# Patient Record
Sex: Male | Born: 1995 | Race: Black or African American | Hispanic: No | Marital: Single | Smoking: Former smoker
Health system: Southern US, Community
[De-identification: ages and names within clinical notes are randomized; demographics above are authoritative.]

## PROBLEM LIST (undated history)

## (undated) DIAGNOSIS — R569 Unspecified convulsions: Secondary | ICD-10-CM

## (undated) HISTORY — PX: OTHER SURGICAL HISTORY: SHX169

---

## 2016-06-17 ENCOUNTER — Encounter (HOSPITAL_COMMUNITY): Payer: Self-pay | Admitting: Emergency Medicine

## 2016-06-17 ENCOUNTER — Emergency Department (HOSPITAL_COMMUNITY)
Admission: EM | Admit: 2016-06-17 | Discharge: 2016-06-17 | Discharge status: Against Medical Advice | Payer: Medicaid Other | Attending: Emergency Medicine | Admitting: Emergency Medicine

## 2016-06-17 DIAGNOSIS — G40909 Epilepsy, unspecified, not intractable, without status epilepticus: Secondary | ICD-10-CM | POA: Diagnosis present

## 2016-06-17 DIAGNOSIS — R569 Unspecified convulsions: Secondary | ICD-10-CM

## 2016-06-17 DIAGNOSIS — F17290 Nicotine dependence, other tobacco product, uncomplicated: Secondary | ICD-10-CM | POA: Insufficient documentation

## 2016-06-17 HISTORY — DX: Unspecified convulsions: R56.9

## 2016-06-17 MED ORDER — LEVETIRACETAM IN NACL 1000 MG/100ML IV SOLN: 1000.0000 mg | Freq: Once | INTRAVENOUS | Status: DC

## 2016-06-17 NOTE — ED Notes (Signed)
Pt wants to leave AMA. Dr Aileen PilotZammitt at bedside and aware of pt's decision.

## 2016-06-17 NOTE — ED Provider Notes (Signed)
[facility] Provider Note   CSN: [id number] Arrival date & time: 06/17/16  1901     History   Chief Complaint Chief Complaint  Patient presents with  . Seizures    HPI Larry Wheeler is a 21 y.o. male.  Patient had a seizure today. He did not take any of his medicines today. Although his family made him take a double dose of Keppra after he had a seizure    Seizures   This is a recurrent problem. The current episode started 1 to 2 hours ago. The problem has been resolved. There was 1 seizure. Pertinent negatives include no sleepiness, no headaches, no chest pain, no cough and no diarrhea. Characteristics do not include eye blinking. The episode was witnessed. There was no sensation of an aura present. The seizures did not continue in the ED. The seizure(s) had no focality. There has been no fever.    Past Medical History:  Diagnosis Date  . Seizures (HCC)     There are no active problems to display for this patient.   History reviewed. No pertinent surgical history.     Home Medications    Prior to Admission medications   Not on File    Family History No family history on file.  Social History Social History  Substance Use Topics  . Smoking status: Current Some Day Smoker    Types: Cigars  . Smokeless tobacco: Never Used  . Alcohol use No     Allergies   Patient has no known allergies.   Review of Systems Review of Systems  Constitutional: Negative for appetite change and fatigue.  HENT: Negative for congestion, ear discharge and sinus pressure.   Eyes: Negative for discharge.  Respiratory: Negative for cough.   Cardiovascular: Negative for chest pain.  Gastrointestinal: Negative for abdominal pain and diarrhea.  Genitourinary: Negative for frequency and hematuria.  Musculoskeletal: Negative for back pain.  Skin: Negative for rash.  Neurological: Positive for seizures. Negative for headaches.  Psychiatric/Behavioral: Negative for  hallucinations.     Physical Exam Updated Vital Signs BP 141/80 (BP Location: Right Arm)   Pulse 76   Temp 98.5 F (36.9 C) (Oral)   Resp 18   Ht 6' (1.829 m)   Wt 185 lb (83.9 kg)   SpO2 100%   BMI 25.09 kg/m   Physical Exam  Constitutional: He is oriented to person, place, and time. He appears well-developed.  HENT:  Head: Normocephalic.  Eyes: Conjunctivae and EOM are normal. No scleral icterus.  Neck: Neck supple. No thyromegaly present.  Cardiovascular: Normal rate and regular rhythm.  Exam reveals no gallop and no friction rub.   No murmur heard. Pulmonary/Chest: No stridor. He has no wheezes. He has no rales. He exhibits no tenderness.  Abdominal: He exhibits no distension. There is no tenderness. There is no rebound.  Musculoskeletal: Normal range of motion. He exhibits no edema.  Lymphadenopathy:    He has no cervical adenopathy.  Neurological: He is oriented to person, place, and time. He exhibits normal muscle tone. Coordination normal.  Skin: No rash noted. No erythema.  Psychiatric: He has a normal mood and affect. His behavior is normal.     ED Treatments / Results  Labs (all labs ordered are listed, but only abnormal results are displayed) Labs Reviewed  I-STAT CHEM 8, ED    EKG  EKG Interpretation None       Radiology No results found.  Procedures Procedures (including critical care time)  Medications Ordered in ED Medications - No data to display   Initial Impression / Assessment and Plan / ED Course  I have reviewed the triage vital signs and the nursing notes.  Pertinent labs & imaging results that were available during my care of the patient were reviewed by me and considered in my medical decision making (see chart for details).     Patient was alert and oriented 4. Patient stated he did not want to get medical care now. That he just missed his medicine. He wants to be discharged home. Patient left AMA  Final Clinical  Impressions(s) / ED Diagnoses   Final diagnoses:  None    New Prescriptions New Prescriptions   No medications on file     Bethann Berkshire, MD 06/17/16 [phone]

## 2016-06-17 NOTE — ED Notes (Signed)
Per Dr. Aileen PilotZammitt, pt to take 2 tablets of his own Trileptal and one tablet of his own Lexapro.

## 2016-06-17 NOTE — ED Triage Notes (Signed)
Pt brought in by [facility] for seizure lasting approximately 3 minutes. Pt stated he didn't take his seizure medication today because he "got too busy".

## 2020-09-16 ENCOUNTER — Emergency Department (HOSPITAL_COMMUNITY)
Admission: EM | Admit: 2020-09-16 | Discharge: 2020-09-17 | Disposition: A | Discharge status: Home / Self Care | Payer: Self-pay | Attending: Emergency Medicine | Admitting: Emergency Medicine

## 2020-09-16 ENCOUNTER — Emergency Department (HOSPITAL_COMMUNITY): Payer: Self-pay

## 2020-09-16 ENCOUNTER — Other Ambulatory Visit: Payer: Self-pay

## 2020-09-16 ENCOUNTER — Encounter (HOSPITAL_COMMUNITY): Payer: Self-pay | Admitting: Emergency Medicine

## 2020-09-16 DIAGNOSIS — T148XXA Other injury of unspecified body region, initial encounter: Secondary | ICD-10-CM

## 2020-09-16 DIAGNOSIS — W500XXA Accidental hit or strike by another person, initial encounter: Secondary | ICD-10-CM | POA: Insufficient documentation

## 2020-09-16 DIAGNOSIS — Y92009 Unspecified place in unspecified non-institutional (private) residence as the place of occurrence of the external cause: Secondary | ICD-10-CM | POA: Insufficient documentation

## 2020-09-16 DIAGNOSIS — Z23 Encounter for immunization: Secondary | ICD-10-CM | POA: Insufficient documentation

## 2020-09-16 DIAGNOSIS — S21112A Laceration without foreign body of left front wall of thorax without penetration into thoracic cavity, initial encounter: Secondary | ICD-10-CM | POA: Insufficient documentation

## 2020-09-16 HISTORY — DX: Unspecified convulsions: R56.9

## 2020-09-16 LAB — PROTIME-INR
INR: 1 (ref 0.8–1.2)
Prothrombin Time: 13.4 seconds (ref 11.4–15.2)

## 2020-09-16 LAB — COMPREHENSIVE METABOLIC PANEL
ALT: 15 U/L (ref 0–44)
AST: 24 U/L (ref 15–41)
Albumin: 3.9 g/dL (ref 3.5–5.0)
Alkaline Phosphatase: 49 U/L (ref 38–126)
Anion gap: 8 (ref 5–15)
BUN: 14 mg/dL (ref 6–20)
CO2: 25 mmol/L (ref 22–32)
Calcium: 9.1 mg/dL (ref 8.9–10.3)
Chloride: 109 mmol/L (ref 98–111)
Creatinine, Ser: 1.07 mg/dL (ref 0.61–1.24)
GFR, Estimated: >60 mL/min (ref >60)
Glucose, Bld: 94 mg/dL (ref 70–99)
Potassium: 3.9 mmol/L (ref 3.5–5.1)
Sodium: 142 mmol/L (ref 135–145)
Total Bilirubin: 0.6 mg/dL (ref 0.3–1.2)
Total Protein: 6.4 g/dL — ABNORMAL LOW (ref 6.5–8.1)

## 2020-09-16 LAB — CBC
HCT: 37.8 % — ABNORMAL LOW (ref 39.0–52.0)
Hemoglobin: 13.1 g/dL (ref 13.0–17.0)
MCH: 28.5 pg (ref 26.0–34.0)
MCHC: 34.7 g/dL (ref 30.0–36.0)
MCV: 82.2 fL (ref 80.0–100.0)
Platelets: 202 10*3/uL (ref 150–400)
RBC: 4.6 MIL/uL (ref 4.22–5.81)
RDW: 12.8 % (ref 11.5–15.5)
WBC: 7 10*3/uL (ref 4.0–10.5)
nRBC: 0 % (ref 0.0–0.2)

## 2020-09-16 LAB — I-STAT CHEM 8, ED
BUN: 16 mg/dL (ref 6–20)
Calcium, Ion: 1.16 mmol/L (ref 1.15–1.40)
Chloride: 107 mmol/L (ref 98–111)
Creatinine, Ser: 0.9 mg/dL (ref 0.61–1.24)
Glucose, Bld: 95 mg/dL (ref 70–99)
HCT: 37 % — ABNORMAL LOW (ref 39.0–52.0)
Hemoglobin: 12.6 g/dL — ABNORMAL LOW (ref 13.0–17.0)
Potassium: 3.7 mmol/L (ref 3.5–5.1)
Sodium: 143 mmol/L (ref 135–145)
TCO2: 25 mmol/L (ref 22–32)

## 2020-09-16 LAB — ETHANOL: Alcohol, Ethyl (B): <10 mg/dL (ref <10)

## 2020-09-16 LAB — SAMPLE TO BLOOD BANK

## 2020-09-16 LAB — LACTIC ACID, PLASMA: Lactic Acid, Venous: 1.3 mmol/L (ref 0.5–1.9)

## 2020-09-16 MED ORDER — IOHEXOL 300 MG/ML  SOLN
75.0000 mL | Freq: Once | INTRAMUSCULAR | Status: AC | PRN
  Administered 2020-09-16: 75 mL via INTRAVENOUS

## 2020-09-16 MED ORDER — TETANUS-DIPHTH-ACELL PERTUSSIS 5-2.5-18.5 LF-MCG/0.5 IM SUSY
0.5000 mL | PREFILLED_SYRINGE | Freq: Once | INTRAMUSCULAR | Status: AC
  Administered 2020-09-16: 0.5 mL via INTRAMUSCULAR

## 2020-09-16 MED ORDER — CEFAZOLIN SODIUM 1 G IJ SOLR
INTRAMUSCULAR | Status: AC | PRN
  Administered 2020-09-16: 2 g via INTRAVENOUS

## 2020-09-16 MED ORDER — LIDOCAINE HCL (PF) 1 % IJ SOLN
30.0000 mL | Freq: Once | INTRAMUSCULAR | Status: AC
  Administered 2020-09-16: 30 mL via INTRADERMAL
  Filled 2020-09-16: qty 30
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
## 2020-09-16 NOTE — ED Notes (Signed)
Patient transported to CT 
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
## 2021-01-25 ENCOUNTER — Emergency Department (HOSPITAL_COMMUNITY): Payer: Self-pay

## 2021-01-25 ENCOUNTER — Other Ambulatory Visit: Payer: Self-pay

## 2021-01-25 ENCOUNTER — Encounter (HOSPITAL_COMMUNITY): Payer: Self-pay

## 2021-01-25 ENCOUNTER — Emergency Department (HOSPITAL_COMMUNITY)
Admission: EM | Admit: 2021-01-25 | Discharge: 2021-01-25 | Disposition: A | Discharge status: Home / Self Care | Payer: Self-pay | Attending: Emergency Medicine | Admitting: Emergency Medicine

## 2021-01-25 DIAGNOSIS — R197 Diarrhea, unspecified: Secondary | ICD-10-CM | POA: Insufficient documentation

## 2021-01-25 DIAGNOSIS — R0781 Pleurodynia: Secondary | ICD-10-CM | POA: Insufficient documentation

## 2021-01-25 DIAGNOSIS — Z87891 Personal history of nicotine dependence: Secondary | ICD-10-CM | POA: Insufficient documentation

## 2021-01-25 DIAGNOSIS — R1012 Left upper quadrant pain: Secondary | ICD-10-CM | POA: Insufficient documentation

## 2021-01-25 MED ORDER — OXYCODONE-ACETAMINOPHEN 5-325 MG PO TABS: 1.0000 | ORAL_TABLET | Freq: Once | ORAL | Status: DC
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
## 2022-08-10 DIAGNOSIS — R109 Unspecified abdominal pain: Secondary | ICD-10-CM | POA: Diagnosis not present

## 2022-08-10 DIAGNOSIS — R112 Nausea with vomiting, unspecified: Secondary | ICD-10-CM | POA: Diagnosis not present

## 2022-08-10 DIAGNOSIS — Z79899 Other long term (current) drug therapy: Secondary | ICD-10-CM | POA: Diagnosis not present

## 2022-08-10 DIAGNOSIS — R197 Diarrhea, unspecified: Secondary | ICD-10-CM | POA: Diagnosis not present

## 2022-08-10 DIAGNOSIS — G40909 Epilepsy, unspecified, not intractable, without status epilepticus: Secondary | ICD-10-CM | POA: Diagnosis not present

## 2022-08-10 DIAGNOSIS — F1290 Cannabis use, unspecified, uncomplicated: Secondary | ICD-10-CM | POA: Diagnosis not present

## 2022-08-10 DIAGNOSIS — K529 Noninfective gastroenteritis and colitis, unspecified: Secondary | ICD-10-CM | POA: Diagnosis not present

## 2022-10-11 DIAGNOSIS — F1290 Cannabis use, unspecified, uncomplicated: Secondary | ICD-10-CM | POA: Diagnosis not present

## 2022-10-11 DIAGNOSIS — R569 Unspecified convulsions: Secondary | ICD-10-CM | POA: Diagnosis not present

## 2022-10-11 DIAGNOSIS — Z79899 Other long term (current) drug therapy: Secondary | ICD-10-CM | POA: Diagnosis not present

## 2022-10-11 DIAGNOSIS — G40909 Epilepsy, unspecified, not intractable, without status epilepticus: Secondary | ICD-10-CM | POA: Diagnosis not present
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
## 2022-11-20 DIAGNOSIS — K047 Periapical abscess without sinus: Secondary | ICD-10-CM | POA: Diagnosis not present

## 2022-11-20 DIAGNOSIS — K0889 Other specified disorders of teeth and supporting structures: Secondary | ICD-10-CM | POA: Diagnosis not present

## 2023-05-07 DIAGNOSIS — K5900 Constipation, unspecified: Secondary | ICD-10-CM | POA: Diagnosis not present

## 2023-05-07 DIAGNOSIS — R109 Unspecified abdominal pain: Secondary | ICD-10-CM | POA: Diagnosis not present
